# Patient Record
Sex: Female | Born: 1955 | Race: White | Hispanic: No | Marital: Married | Smoking: Never smoker
Health system: Southern US, Community
[De-identification: ages and names within clinical notes are randomized; demographics above are authoritative.]

## PROBLEM LIST (undated history)

## (undated) DIAGNOSIS — Z9289 Personal history of other medical treatment: Secondary | ICD-10-CM

## (undated) DIAGNOSIS — D069 Carcinoma in situ of cervix, unspecified: Secondary | ICD-10-CM

## (undated) DIAGNOSIS — C50919 Malignant neoplasm of unspecified site of unspecified female breast: Secondary | ICD-10-CM

## (undated) DIAGNOSIS — Z9889 Other specified postprocedural states: Secondary | ICD-10-CM

## (undated) HISTORY — DX: Other specified postprocedural states: Z98.890

## (undated) HISTORY — DX: Personal history of other medical treatment: Z92.89

## (undated) HISTORY — DX: Malignant neoplasm of unspecified site of unspecified female breast: C50.919

## (undated) HISTORY — DX: Carcinoma in situ of cervix, unspecified: D06.9

---

## 1986-12-19 DIAGNOSIS — D069 Carcinoma in situ of cervix, unspecified: Secondary | ICD-10-CM

## 1986-12-19 HISTORY — DX: Carcinoma in situ of cervix, unspecified: D06.9

## 2002-03-06 ENCOUNTER — Ambulatory Visit (HOSPITAL_COMMUNITY): Admission: RE | Admit: 2002-03-06 | Discharge: 2002-03-06 | Payer: Self-pay | Admitting: Gastroenterology

## 2002-03-06 ENCOUNTER — Encounter: Payer: Self-pay | Admitting: Gastroenterology

## 2002-12-19 HISTORY — PX: BREAST ENHANCEMENT SURGERY: SHX7

## 2003-06-17 ENCOUNTER — Other Ambulatory Visit: Admission: RE | Admit: 2003-06-17 | Discharge: 2003-06-17 | Payer: Self-pay | Admitting: Obstetrics and Gynecology

## 2004-08-30 ENCOUNTER — Other Ambulatory Visit: Admission: RE | Admit: 2004-08-30 | Discharge: 2004-08-30 | Payer: Self-pay | Admitting: Obstetrics and Gynecology

## 2004-09-06 ENCOUNTER — Encounter: Admission: RE | Admit: 2004-09-06 | Discharge: 2004-09-06 | Payer: Self-pay | Admitting: Obstetrics and Gynecology

## 2005-06-20 ENCOUNTER — Other Ambulatory Visit: Admission: RE | Admit: 2005-06-20 | Discharge: 2005-06-20 | Payer: Self-pay | Admitting: Obstetrics and Gynecology

## 2005-12-27 ENCOUNTER — Other Ambulatory Visit: Admission: RE | Admit: 2005-12-27 | Discharge: 2005-12-27 | Payer: Self-pay | Admitting: Obstetrics and Gynecology

## 2007-10-15 ENCOUNTER — Encounter: Admission: RE | Admit: 2007-10-15 | Discharge: 2007-10-15 | Payer: Self-pay | Admitting: Obstetrics and Gynecology

## 2009-09-03 ENCOUNTER — Encounter: Admission: RE | Admit: 2009-09-03 | Discharge: 2009-09-03 | Payer: Self-pay | Admitting: Obstetrics and Gynecology

## 2009-09-15 ENCOUNTER — Encounter: Admission: RE | Admit: 2009-09-15 | Discharge: 2009-09-15 | Payer: Self-pay | Admitting: Gastroenterology

## 2009-09-18 DIAGNOSIS — Z9289 Personal history of other medical treatment: Secondary | ICD-10-CM

## 2009-09-18 DIAGNOSIS — Z9889 Other specified postprocedural states: Secondary | ICD-10-CM

## 2009-09-18 HISTORY — DX: Personal history of other medical treatment: Z92.89

## 2009-09-18 HISTORY — DX: Other specified postprocedural states: Z98.890

## 2011-07-13 ENCOUNTER — Other Ambulatory Visit: Payer: Self-pay | Admitting: Obstetrics and Gynecology

## 2011-07-13 DIAGNOSIS — R928 Other abnormal and inconclusive findings on diagnostic imaging of breast: Secondary | ICD-10-CM

## 2011-07-19 ENCOUNTER — Ambulatory Visit
Admission: RE | Admit: 2011-07-19 | Discharge: 2011-07-19 | Disposition: A | Discharge status: Home / Self Care | Payer: BC Managed Care – PPO | Source: Ambulatory Visit | Attending: Obstetrics and Gynecology | Admitting: Obstetrics and Gynecology

## 2011-07-19 ENCOUNTER — Other Ambulatory Visit: Payer: Self-pay

## 2011-07-19 ENCOUNTER — Other Ambulatory Visit: Payer: Self-pay | Admitting: Obstetrics and Gynecology

## 2011-07-19 DIAGNOSIS — R928 Other abnormal and inconclusive findings on diagnostic imaging of breast: Secondary | ICD-10-CM

## 2011-07-20 ENCOUNTER — Other Ambulatory Visit: Payer: Self-pay | Admitting: Obstetrics and Gynecology

## 2011-07-20 DIAGNOSIS — C50911 Malignant neoplasm of unspecified site of right female breast: Secondary | ICD-10-CM

## 2011-07-23 ENCOUNTER — Ambulatory Visit
Admission: RE | Admit: 2011-07-23 | Discharge: 2011-07-23 | Disposition: A | Discharge status: Home / Self Care | Payer: BC Managed Care – PPO | Source: Ambulatory Visit | Attending: Obstetrics and Gynecology | Admitting: Obstetrics and Gynecology

## 2011-07-23 DIAGNOSIS — C50911 Malignant neoplasm of unspecified site of right female breast: Secondary | ICD-10-CM

## 2011-07-23 MED ORDER — GADOBENATE DIMEGLUMINE 529 MG/ML IV SOLN
13.0000 mL | Freq: Once | INTRAVENOUS | Status: AC | PRN
  Administered 2011-07-23: 13 mL via INTRAVENOUS

## 2011-08-03 ENCOUNTER — Ambulatory Visit (HOSPITAL_BASED_OUTPATIENT_CLINIC_OR_DEPARTMENT_OTHER): Payer: BC Managed Care – PPO | Admitting: General Surgery

## 2011-08-03 ENCOUNTER — Other Ambulatory Visit: Payer: Self-pay | Admitting: Oncology

## 2011-08-03 ENCOUNTER — Encounter (HOSPITAL_BASED_OUTPATIENT_CLINIC_OR_DEPARTMENT_OTHER): Payer: BC Managed Care – PPO | Admitting: Oncology

## 2011-08-03 ENCOUNTER — Encounter (INDEPENDENT_AMBULATORY_CARE_PROVIDER_SITE_OTHER): Payer: Self-pay | Admitting: General Surgery

## 2011-08-03 VITALS — BP 106/70 | HR 64 | Temp 98.1°F | Resp 20 | Ht 62.3 in

## 2011-08-03 DIAGNOSIS — C50219 Malignant neoplasm of upper-inner quadrant of unspecified female breast: Secondary | ICD-10-CM

## 2011-08-03 DIAGNOSIS — C50419 Malignant neoplasm of upper-outer quadrant of unspecified female breast: Secondary | ICD-10-CM

## 2011-08-03 DIAGNOSIS — C50919 Malignant neoplasm of unspecified site of unspecified female breast: Secondary | ICD-10-CM | POA: Insufficient documentation

## 2011-08-03 LAB — CBC WITH DIFFERENTIAL/PLATELET
BASO%: 0.8 % (ref 0.0–2.0)
Basophils Absolute: 0.1 10*3/uL (ref 0.0–0.1)
EOS%: 1.7 % (ref 0.0–7.0)
MCH: 29.9 pg (ref 25.1–34.0)
MCHC: 34.9 g/dL (ref 31.5–36.0)
MCV: 85.7 fL (ref 79.5–101.0)
MONO%: 5.2 % (ref 0.0–14.0)
RBC: 4.46 10*6/uL (ref 3.70–5.45)
RDW: 12 % (ref 11.2–14.5)
lymph#: 1.7 10*3/uL (ref 0.9–3.3)

## 2011-08-03 LAB — COMPREHENSIVE METABOLIC PANEL
ALT: 13 U/L (ref 0–35)
AST: 14 U/L (ref 0–37)
Albumin: 4 g/dL (ref 3.5–5.2)
Alkaline Phosphatase: 48 U/L (ref 39–117)
BUN: 15 mg/dL (ref 6–23)
Calcium: 9.5 mg/dL (ref 8.4–10.5)
Chloride: 99 mEq/L (ref 96–112)
Potassium: 3.7 mEq/L (ref 3.5–5.3)

## 2011-08-03 NOTE — Progress Notes (Signed)
Subjective:     Patient ID: Marisa Washington, female   DOB: July 17, 1956, 55 y.o.   MRN: [id number]  HPI This is a 55 year old Caucasian female, referred to the breast multidisciplinary clinic by Dr. Cain Saupe at the [facility]. Dr. Richardean Chimera is her primary care physician in [place].  The patient was recently diagnosed with breast cancer in the upper inner quadrant of the right breast. She has no history of any breast problems  in the past. She had bilateral subpectoral implants placed in 2004 by Dr. Babette Relic. She does not perceive any mass or abnormality of her breasts.  Recent mammograms and ultrasound were performed and they show a 1 cm mass in the right breast, upper inner quadrant, 1:00 position, 2 cm from the nipple. This is nonpalpable. Image guided biopsy shows invasive and in situ carcinoma which is most likely lobular in phenotype. Estrogen and progesterone are  positive. HER-2 is equivocal. Ki 67 is 15%.  MRI was subsequently performed and this shows a 1.2 cm solitary mass in the medial aspect of the right breast, similar location to the mammogram and ultrasound.  She was seen at [facility] hours ago, and was given the option of mastectomy versus lumpectomy and postop radiation therapy and chemotherapy. She is here today for a second opinion.  She used to live in [place] now lives with her husband in [place].  Past Medical History  Diagnosis Date  . Carcinoma in situ of cervix 1988    Treated [facility]  . History of colonoscopy 10/10  . History of bone density study 10/10   Current Outpatient Prescriptions  Medication Sig Dispense Refill  . calcium citrate-vitamin D 200-200 MG-UNIT TABS Take 1,200 tablets by mouth daily.        . ergocalciferol (VITAMIN D2) 50000 UNITS capsule Take 4,000 Units by mouth daily.         Allergies  Allergen Reactions  . Cinnamon Anaphylaxis    Family History  Problem Relation Age  of Onset  . Ovarian cancer Maternal Aunt   . Bone cancer Cousin     History  Substance Use Topics  . Smoking status: Never Smoker   . Smokeless tobacco: Not on file  . Alcohol Use: No      Review of Systems  Constitutional: Negative.   HENT: Negative.   Eyes: Negative.   Respiratory: Negative.   Cardiovascular: Negative.   Genitourinary: Negative.   Musculoskeletal: Negative.   Neurological: Negative.   Psychiatric/Behavioral: Negative.        Objective:   Physical Exam  Constitutional: She is oriented to person, place, and time. She appears well-developed and well-nourished. No distress.  HENT:  Head: Normocephalic and atraumatic.  Nose: Nose normal.  Mouth/Throat: No oropharyngeal exudate.  Eyes: Conjunctivae and EOM are normal. Pupils are equal, round, and reactive to light. No scleral icterus.  Neck: Normal range of motion. Neck supple. No JVD present. No tracheal deviation present. No thyromegaly present.  Cardiovascular: Normal rate, regular rhythm, normal heart sounds and intact distal pulses.  Exam reveals no gallop.   No murmur heard. Pulmonary/Chest: Effort normal and breath sounds normal. No respiratory distress. She has no wheezes. She has no rales. She exhibits no tenderness.    Abdominal: Soft. Bowel sounds are normal. She exhibits no distension and no mass. There is no tenderness. There is no rebound and no guarding.  Musculoskeletal: Normal range of motion. She exhibits no edema and no  tenderness.  Lymphadenopathy:    She has no cervical adenopathy.  Neurological: She is alert and oriented to person, place, and time. She exhibits normal muscle tone. Coordination normal.  Skin: Skin is warm and dry. No rash noted. She is not diaphoretic. No erythema. No pallor.  Psychiatric: She has a normal mood and affect. Her behavior is normal. Judgment and thought content normal.       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 Grade 1/3. Receptors positive, HER-2 equivocal, Ki 67 15%.  Positive family history for ovarian cancer in a maternal aunt.  Negative family history for breast cancer.  Status post bilateral subpectoral saline implants.    Plan:     We spent a lot of time talking about surgical options. At the end of the conversation she clearly would like to have breast conservation. I think she is a good candidate for partial mastectomy, sentinel lymph node mapping and biopsy, and adjuvant radiation therapy.  Dr. Pierce Crane is undecided whether she needs oncotype DX. Will decide post op..  She and her husband willgo home and decide whether she will have her surgery here or at [facility].  She will probably have her adjuvant radiation therapy in [place] since she lives much closer to [facility].  I will wait to hear her decision.

## 2011-08-03 NOTE — Patient Instructions (Signed)
I feel that you are a good candidate for breast conservation. The next setting your care will be a partial mastectomy with needle localization, sentinel lymph node biopsy, and postop radiation therapy. Her medical oncologist and radiation oncologist will decide about the details of your other therapies.  You are still deciding as to whether you want to have her surgery here or at [facility]. Please give [place] a call in the next few days when she make her decision. We will be happy to help you with your treatment plans in any way we can.

## 2011-08-08 ENCOUNTER — Telehealth (INDEPENDENT_AMBULATORY_CARE_PROVIDER_SITE_OTHER): Payer: Self-pay | Admitting: General Surgery

## 2012-11-01 ENCOUNTER — Other Ambulatory Visit: Payer: Self-pay | Admitting: Obstetrics and Gynecology

## 2012-11-13 ENCOUNTER — Other Ambulatory Visit: Payer: Self-pay | Admitting: Obstetrics and Gynecology

## 2014-08-04 ENCOUNTER — Other Ambulatory Visit: Payer: Self-pay | Admitting: Obstetrics and Gynecology

## 2014-08-08 LAB — CYTOLOGY - PAP

## 2015-03-30 ENCOUNTER — Other Ambulatory Visit: Payer: Self-pay | Admitting: Obstetrics and Gynecology

## 2015-03-31 LAB — CYTOLOGY - PAP

## 2018-09-28 ENCOUNTER — Encounter: Payer: Self-pay | Admitting: Cardiovascular Disease

## 2018-09-28 ENCOUNTER — Ambulatory Visit (INDEPENDENT_AMBULATORY_CARE_PROVIDER_SITE_OTHER): Payer: 59 | Admitting: Cardiovascular Disease

## 2018-09-28 VITALS — BP 124/68 | HR 66 | Ht 61.0 in | Wt 153.4 lb

## 2018-09-28 DIAGNOSIS — I10 Essential (primary) hypertension: Secondary | ICD-10-CM | POA: Diagnosis not present

## 2018-09-28 DIAGNOSIS — R609 Edema, unspecified: Secondary | ICD-10-CM

## 2018-09-28 DIAGNOSIS — R000 Tachycardia, unspecified: Secondary | ICD-10-CM

## 2018-09-28 DIAGNOSIS — Z8249 Family history of ischemic heart disease and other diseases of the circulatory system: Secondary | ICD-10-CM | POA: Diagnosis not present

## 2018-09-28 NOTE — Progress Notes (Signed)
Cardiology Office Note   Date:  09/28/2018   ID:  Marisa Washington, DOB 1956-02-16, MRN [id number]  PCP:  Patient, No Pcp Per  Cardiologist:   Skeet Latch, MD   No chief complaint on file.     History of Present Illness: Marisa Washington is a 62 y.o. female with prior breast cancer who is being seen today for the evaluation of palpitations at the request of Arvella Nigh, MD.  [name], Dr. [name] and noted that her heart sometimes feels like it is racing.  This occurs almost daily.  She starts feeling funny and then checks her heart rate is between 90 and 120.  It was happening daily but it seems to be low.  She also notices that her exercise heart rate 30 and still 120 bpm she is noticed that her friends have the same issue and she wonders if it is normal in 2014 she saw [facility] for similar symptoms.  This was after being treated with chemotherapy and radiation for breast cancer.  She had an echocardiogram at that time that revealed normal systolic function.  She also wore a 24-hour Holter monitor that revealed sinus tachycardia.  [name] has not been feeling stressed.  She has 1 glass of tea daily but no coffee.  She has mild lower extremity edema that gets somewhat better in the morning but does not improve entirely.  She denies orthopnea or PND.  She has an extensive family history of CAD.  Her father needed bypass in his 74s.  Her paternal grandfather had a heart attack in his 30s.  Her mother and older siblings have heart failure.  She exercises by walking 20 to 30 minutes 3 days/week.  She has no exertional symptoms.  She notes that she has not interested in taking medications at all possible.    Past Medical History:  Diagnosis Date  . Carcinoma in situ of cervix [facility]  . History of bone density study 10/10  . History of colonoscopy 10/10    Past Surgical History:  Procedure Laterality Date  . BREAST ENHANCEMENT SURGERY  2004  .  CESAREAN SECTION  92, 98     Current Outpatient Medications  Medication Sig Dispense Refill  . B Complex Vitamins (VITAMIN B COMPLEX PO) Take by mouth.    . Calcium Citrate 250 MG TABS Take by mouth.    . Coenzyme Q10 (COQ10 PO) Take by mouth.    . cyanocobalamin ([employer] VITAMIN B12) 1000 MCG tablet Take by mouth.    . ergocalciferol (VITAMIN D2) 50000 UNITS capsule Take 4,000 Units by mouth daily.      Marland Kitchen exemestane (AROMASIN) 25 MG tablet Take by mouth.    . Lactobacillus Rhamnosus, GG, (CULTURELLE) CAPS Take by mouth.    . Magnesium Gluconate (MAGNESIUM 27 PO) Take by mouth.    . Omega-3 1000 MG CAPS Take by mouth.    . tolterodine (DETROL LA) 4 MG 24 hr capsule Take by mouth.    . TURMERIC PO Take by mouth.    . vitamin E 100 UNIT capsule Take by mouth.     No current facility-administered medications for this visit.     Allergies:   Cinnamon    Social History:  The patient  reports that she has never smoked. She has never used smokeless tobacco. She reports that she does not drink alcohol.   Family History:  The patient's family history includes Bladder  Cancer in her cousin; Bone cancer in her cousin; Breast cancer in her maternal aunt; CAD in her father; Congestive Heart Failure in her mother; Heart attack in her maternal grandfather and paternal grandfather; High blood pressure in her son; Ovarian cancer in her maternal aunt and maternal aunt; Stroke in her maternal grandmother; Valvular heart disease in her mother.    ROS:  Please see the history of present illness.   Otherwise, review of systems are positive for none.   All other systems are reviewed and negative.    PHYSICAL EXAM: VS:  BP 124/68   Pulse 66   Ht 5\' 1"  (2.330 m)   Wt 153 lb 6.4 oz (69.6 kg)   BMI 28.98 kg/m  , BMI Body mass index is 28.98 kg/m. GENERAL:  Well appearing HEENT:  Pupils equal round and reactive, fundi not visualized, oral mucosa unremarkable NECK:  No jugular venous distention, waveform  within normal limits, carotid upstroke brisk and symmetric, no bruits, no thyromegaly LYMPHATICS:  No cervical adenopathy LUNGS:  Clear to auscultation bilaterally HEART:  RRR.  PMI not displaced or sustained,S1 and S2 within normal limits, no S3, no S4, no clicks, no rubs, no murmurs ABD:  Flat, positive bowel sounds normal in frequency in pitch, no bruits, no rebound, no guarding, no midline pulsatile mass, no hepatomegaly, no splenomegaly EXT:  2 plus pulses throughout, no edema, no cyanosis no clubbing SKIN:  No rashes no nodules NEURO:  Cranial nerves II through XII grossly intact, motor grossly intact throughout PSYCH:  Cognitively intact, oriented to person place and time   EKG:  EKG is ordered today. The ekg ordered today demonstrates sinus rhythm.  Rate 66 bpm.   Recent Labs: No results found for requested labs within last 8760 hours.   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  She previously wore a monitor at [facility].  It seems that there is little utility in repeating this as her symptoms are exactly the same.  Overall she would like to not have medications if possible.  Given that she is minimally symptomatically we will not do any beta-blockers at this time.  We will get an echocardiogram.  If there is any evidence of heart failure then we will have to reconsider.  She already had the appropriate laboratory testing 12/2017 and her symptoms have not changed since that time.   # FH heart failure: # Edema: # Tachycardia: [name] doesn't have any evidence of heart failure on exam.  However, she has a strong family history and  has had some lower extremity edema.  We will get an echocardiogram as above.  # Hyperlipidemia:  ASCVD 10 year risk 4.5%.  Given her family history we will get a coronary calcium score to better determine if she needs statin therapy.   Current medicines are reviewed at length with the patient today.  The patient does not have concerns regarding medicines.  The following changes have been made:  no change  Labs/ tests ordered today include:   Orders Placed This Encounter  Procedures  . CT CARDIAC SCORING  . EKG 12-Lead  . ECHOCARDIOGRAM  COMPLETE     Disposition:   FU with Macai Sisneros C. Oval Linsey, MD, [facility] in 2 months.      Signed, Detric Scalisi C. Oval Linsey, MD, [facility]  09/28/2018 4:51 PM    [facility]

## 2018-09-28 NOTE — Patient Instructions (Addendum)
Medication Instructions:  Your physician recommends that you continue on your current medications as directed. Please refer to the Current Medication list given to you today.  If you need a refill on your cardiac medications before your next appointment, please call your pharmacy.   Lab work: NONE  Testing/Procedures: CALCIUM SCORE, $150 OUT OF POCKET  Your physician has requested that you have an echocardiogram. Echocardiography is a painless test that uses sound waves to create images of your heart. It provides your doctor with information about the size and shape of your heart and how well your heart's chambers and valves are working. This procedure takes approximately one hour. There are no restrictions for this procedure.  BOTH OF THE ABOVE WITH BE DONE AT [place] AT [place] [address] ON THE SAME DAY IF POSSIBLE   Follow-Up: At [facility], you and your health needs are our priority.  As part of our continuing mission to provide you with exceptional heart care, we have created designated Provider Care Teams.  These Care Teams include your primary Cardiologist (physician) and Advanced Practice Providers (APPs -  Physician Assistants and Nurse Practitioners) who all work together to provide you with the care you need, when you need it. You will need a follow up appointment in 2 months. DR [facility] or one of the following Advanced Practice Providers on your designated Care Team:   Kerin Ransom, PA-C [place], [place] . Sande Rives, PA-C  Echocardiogram An echocardiogram, or echocardiography, uses sound waves (ultrasound) to produce an image of your heart. The echocardiogram is simple, painless, obtained within a short period of time, and offers valuable information to your health care provider. The images from an echocardiogram can provide information such as:  Evidence of coronary artery disease (CAD).  Heart size.  Heart muscle function.  Heart valve  function.  Aneurysm detection.  Evidence of a past heart attack.  Fluid buildup around the heart.  Heart muscle thickening.  Assess heart valve function.  Tell a health care provider about:  Any allergies you have.  All medicines you are taking, including vitamins, herbs, eye drops, creams, and over-the-counter medicines.  Any problems you or family members have had with anesthetic medicines.  Any blood disorders you have.  Any surgeries you have had.  Any medical conditions you have.  Whether you are pregnant or may be pregnant. What happens before the procedure? No special preparation is needed. Eat and drink normally. What happens during the procedure?  In order to produce an image of your heart, gel will be applied to your chest and a wand-like tool (transducer) will be moved over your chest. The gel will help transmit the sound waves from the transducer. The sound waves will harmlessly bounce off your heart to allow the heart images to be captured in real-time motion. These images will then be recorded.  You may need an IV to receive a medicine that improves the quality of the pictures. What happens after the procedure? You may return to your normal schedule including diet, activities, and medicines, unless your health care provider tells you otherwise. This information is not intended to replace advice given to you by your health care provider. Make sure you discuss any questions you have with your health care provider. Document Released: 12/02/2000 Document Revised: 07/23/2016 Document Reviewed: 08/12/2013 Elsevier Interactive Patient Education  2017 [employer].

## 2018-10-11 ENCOUNTER — Other Ambulatory Visit (HOSPITAL_COMMUNITY): Payer: 59

## 2018-10-11 ENCOUNTER — Other Ambulatory Visit: Payer: 59

## 2018-10-23 ENCOUNTER — Other Ambulatory Visit: Payer: Self-pay

## 2018-10-23 ENCOUNTER — Ambulatory Visit (INDEPENDENT_AMBULATORY_CARE_PROVIDER_SITE_OTHER)
Admission: RE | Admit: 2018-10-23 | Discharge: 2018-10-23 | Disposition: A | Discharge status: Home / Self Care | Payer: Self-pay | Source: Ambulatory Visit | Attending: Cardiovascular Disease | Admitting: Cardiovascular Disease

## 2018-10-23 ENCOUNTER — Ambulatory Visit (HOSPITAL_COMMUNITY): Payer: 59 | Attending: Cardiovascular Disease

## 2018-10-23 DIAGNOSIS — R609 Edema, unspecified: Secondary | ICD-10-CM | POA: Insufficient documentation

## 2018-10-23 DIAGNOSIS — R000 Tachycardia, unspecified: Secondary | ICD-10-CM

## 2018-11-22 ENCOUNTER — Ambulatory Visit: Payer: 59 | Admitting: Cardiovascular Disease

## 2020-01-03 ENCOUNTER — Telehealth: Payer: Self-pay | Admitting: *Deleted

## 2020-01-03 NOTE — Telephone Encounter (Signed)
Patient returned call and scheduled appt.

## 2020-01-03 NOTE — Telephone Encounter (Signed)
Attempted to reach the patient to schedule an appt., left the patient a message to call the office back

## 2020-01-07 ENCOUNTER — Other Ambulatory Visit: Payer: Self-pay

## 2020-01-27 ENCOUNTER — Encounter: Payer: Self-pay | Admitting: Gynecologic Oncology

## 2020-01-27 NOTE — Progress Notes (Signed)
GYNECOLOGIC ONCOLOGY NEW PATIENT CONSULTATION   Patient Name: Marisa Washington  Patient Age: 64 y.o. Date of Service: 01/31/20 Referring Provider: Dr. Radene Knee  Primary Care Provider: Patient, No Pcp Per Consulting Provider: Jeral Pinch, MD   Assessment/Plan:  Postmenopausal patient with history of breast cancer currently on Aromasin with family history of both breast and ovarian cancer (negative genetic testing) presenting due to an elevated Ca1 25.  I had a long discussion with the patient and her husband regarding her family history as well as known personal history.  Although she has negative genetic testing, I suspect that she has some increased risk (likely 2-3 fold) of ovarian cancer given her family history.  We spoke about the limitations of the tools that we have for ovarian cancer screening, notably pelvic ultrasound and Ca1 25.  While the patient had a normal pelvic ultrasound, her Ca1 25 was elevated at 61.  I stressed that this is not a sensitive or specific test and that many processes, including inflammatory processes, can cause an elevation in Ca1 25.  I recommended that we start by repeating a CA-125 today.  The patient understands that there may be some variability the result due to the lab.  In terms of next steps, we discussed the possibility of continued close surveillance with recommendation for ultrasound, CA-125 and pelvic exam every 6-12 months, similar to patients with a known genetic susceptibility that increases the risk of ovarian cancer.  I also think it would be quite reasonable to move forward with risk reducing surgery in the form of bilateral salpingo-oophorectomy.  We briefly discussed this surgery and that it could be performed in the outpatient setting in a minimally invasive fashion.  We discussed the small but present risk for occult cancer at the time of surgery.  The patient ultimately would like to wait to see what her repeat CA-125 result is and then we  will discuss by phone early next week next steps.  If she decides to move forward with bilateral salpingo-oophorectomy, we will have her return for a visit to include an exam and preoperative counseling.  We briefly discussed concurrent hysterectomy and, based on what she reports is her history, I do not believe that there is a strong indication for removal of her uterus at this time.  It sounds like she has either had ASCUS Paps or low-grade dysplasia if her Paps truly have been abnormal since her excisional procedure excellently 30 years ago.  A copy of this note was sent to the patient's referring provider.   45 minutes of total time was spent for this patient encounter, including preparation, face-to-face counseling with the patient and coordination of care, and documentation of the encounter.   Jeral Pinch, MD  Division of Gynecologic Oncology  Department of Obstetrics and Gynecology  [facility]  ___________________________________________  Chief Complaint: Chief Complaint  Patient presents with  . Elevated CA-125  . History of breast cancer in female    History of Present Illness:  Marisa Washington is a 64 y.o. y.o. female who is seen in consultation at the request of Dr. Radene Knee for an evaluation of an elevated CA-125 with a personal history of breast cancer and family history of breast and ovarian cancer, with negative genetic testing.  Patient reports overall doing well.  She endorses a regular appetite with no nausea or emesis.  She has a longstanding history of what she describes as food intolerance including intermittent abdominal pain and gas  related to certain foods.  She denies any change to her bowel function or urinary symptoms.  She has had some mild and intermittent left mid back pain for the last 2 months with no associated symptoms.  She denies any history of kidney stones.  Her family history is significant for a maternal aunt diagnosed with  ovarian cancer at age 62, deceased in her mid 62s.  She also has a maternal great aunt with a history of breast cancer and a maternal great aunt with a history of ovarian cancer.  She denies any family history of uterine or colon cancer.  The patient had myriad genetic testing in September 2019 that was negative for any clinically significant mutation.  Patient reports that this is the first time she has had a CA-125 or ultrasound performed given her family history.  PAST MEDICAL HISTORY:  Past Medical History:  Diagnosis Date  . Breast cancer ([place])   . 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 History of bone density study 10/10  . History of colonoscopy 10/10     PAST SURGICAL HISTORY:  Past Surgical History:  Procedure Laterality Date  . BREAST ENHANCEMENT SURGERY  2004  . BREAST LUMPECTOMY Right 07/2011  . CESAREAN SECTION  92, 98  . TUBAL LIGATION      OB/GYN HISTORY:  OB History  Gravida Para Term Preterm AB Living  '3 2     1 2  ' SAB TAB Ectopic Multiple Live Births  1            # Outcome Date GA Lbr Len/2nd Weight Sex Delivery Anes PTL Lv  3 SAB           2 Para           1 Para             No LMP recorded. (Menstrual status: Other).  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  She notes that her Pap tests were never normal until her most recent one performed with Dr. Renold Don.  She denies any history of cervical biopsies or further procedures despite these abnormal Pap test.  SCREENING STUDIES:  Last mammogram: 11/2019  Last colonoscopy: 2010 Last bone mineral density: 12/2019  MEDICATIONS: Outpatient Encounter  Medications as of 01/31/2020  Medication Sig  . B Complex Vitamins (VITAMIN B COMPLEX PO) Take by mouth.  . Calcium Citrate 250 MG TABS Take by mouth.  . Cholecalciferol (VITAMIN D3 PO) Take 1 tablet by mouth daily.  . Coenzyme Q10 (COQ10 PO) Take by mouth.  . cyanocobalamin ([employer] VITAMIN B12) 1000 MCG tablet Take by mouth.  . Ergocalciferol (VITAMIN D2 PO) Take by mouth.  Marland Kitchen exemestane (AROMASIN) 25 MG tablet Take 25 mg by mouth daily.  . Lactobacillus Rhamnosus, GG, (CULTURELLE) CAPS Take by mouth.  . Magnesium Gluconate (MAGNESIUM 27 PO) Take by mouth.  . Omega-3 1000 MG CAPS Take by mouth.  . TURMERIC PO Take by mouth.  . vitamin E 100 UNIT capsule Take by mouth.  . [DISCONTINUED] ergocalciferol (VITAMIN D2) 50000 UNITS capsule Take 4,000 Units by mouth daily.    . [  DISCONTINUED] exemestane (AROMASIN) 25 MG tablet Take by mouth.  . [DISCONTINUED] fluticasone (FLONASE) 50 MCG/ACT nasal spray Place 2 sprays into both nostrils daily.  . [DISCONTINUED] tolterodine (DETROL LA) 4 MG 24 hr capsule Take by mouth.   No facility-administered encounter medications on file as of 01/31/2020.    ALLERGIES:  Allergies  Allergen Reactions  . Cinnamon Anaphylaxis  . Epinephrine Palpitations    Heart racing Heart racing      FAMILY HISTORY:  Family History  Problem Relation Age of Onset  . Ovarian cancer Maternal Aunt 20       died at 71  . Bone cancer Cousin   . High blood pressure Son   . Congestive Heart Failure Mother   . Valvular heart disease Mother   . CAD Father   . Stroke Maternal Grandmother   . Heart attack Maternal Grandfather   . Heart attack Paternal Grandfather   . Bladder Cancer Cousin      SOCIAL HISTORY:    Social Connections:   . Frequency of Communication with Friends and Family: Not on file  . Frequency of Social Gatherings with Friends and Family: Not on file  . Attends Religious Services: Not on file  . Active Member of Clubs or Organizations: Not on file   . Attends Archivist Meetings: Not on file  . Marital Status: Not on file    REVIEW OF SYSTEMS:  Denies appetite changes, fevers, chills, fatigue, unexplained weight changes. Denies hearing loss, neck lumps or masses, mouth sores, ringing in ears or voice changes. Denies cough or wheezing.  Denies shortness of breath. Denies chest pain or palpitations. Denies leg swelling. Denies abdominal distention, pain, blood in stools, constipation, diarrhea, nausea, vomiting, or early satiety. Denies pain with intercourse, dysuria, frequency, hematuria or incontinence. Denies hot flashes, pelvic pain, vaginal bleeding or vaginal discharge.   Denies joint pain, back pain or muscle pain/cramps. Denies itching, rash, or wounds. Denies dizziness, headaches, numbness or seizures. Denies swollen lymph nodes or glands, denies easy bruising or bleeding. Denies anxiety, depression, confusion, or decreased concentration.  Physical Exam:  Vital Signs for this encounter:  Blood pressure 108/66, pulse 65, temperature 98.5 F (36.9 C), temperature source Temporal, resp. rate 17, height '5\' 1"'  (1.549 m), weight 144 lb 14.4 oz (65.7 kg), SpO2 100 %. Body mass index is 27.38 kg/m. General: Alert, oriented, no acute distress.  HEENT: Normocephalic, atraumatic. Sclera anicteric.  Chest: Unlabored breathing on room air. Extremities: Grossly normal range of motion. Warm, well perfused. No edema bilaterally.  Skin: No rashes or lesions.  GU: Deferred.  LABORATORY AND RADIOLOGIC DATA:  Outside medical records were reviewed to synthesize the above history, along with the history and physical obtained during the visit.   CA-125 on 1/13: 61.5  Recent pelvic ultrasound: 01/01/20 at [facility] Uterus 4.8 x 2.7 x 3.4cm, endometrium 3.18m. Right ovary 2 x 1.2 x 1.2cm. Left ovary 1.5 x 1.2 x 1.5 cm. No adnexal masses or free fluid.

## 2020-01-28 ENCOUNTER — Telehealth: Payer: Self-pay | Admitting: Oncology

## 2020-01-28 ENCOUNTER — Encounter: Payer: Self-pay | Admitting: Gynecologic Oncology

## 2020-01-28 NOTE — Telephone Encounter (Signed)
Left a message for medical records at Dr. Ophelia Charter office requesting [facility] genetic testing results.  Requested a return call.

## 2020-01-29 NOTE — Telephone Encounter (Signed)
Called patient and she said she had genetic testing through Dr. Sherran Needs office.  Left a message for Johny Shears, Referral Coordinator at Dr. Sherran Needs office regarding genetic testing results.  Requested a return call.

## 2020-01-31 ENCOUNTER — Inpatient Hospital Stay: Payer: BC Managed Care – PPO | Attending: Gynecologic Oncology | Admitting: Gynecologic Oncology

## 2020-01-31 ENCOUNTER — Encounter: Payer: Self-pay | Admitting: Gynecologic Oncology

## 2020-01-31 ENCOUNTER — Inpatient Hospital Stay: Payer: BC Managed Care – PPO

## 2020-01-31 ENCOUNTER — Other Ambulatory Visit: Payer: Self-pay

## 2020-01-31 VITALS — BP 108/66 | HR 65 | Temp 98.5°F | Resp 17 | Ht 61.0 in | Wt 144.9 lb

## 2020-01-31 DIAGNOSIS — Z8041 Family history of malignant neoplasm of ovary: Secondary | ICD-10-CM | POA: Insufficient documentation

## 2020-01-31 DIAGNOSIS — Z853 Personal history of malignant neoplasm of breast: Secondary | ICD-10-CM

## 2020-01-31 DIAGNOSIS — R971 Elevated cancer antigen 125 [CA 125]: Secondary | ICD-10-CM

## 2020-01-31 DIAGNOSIS — C50919 Malignant neoplasm of unspecified site of unspecified female breast: Secondary | ICD-10-CM | POA: Diagnosis not present

## 2020-01-31 DIAGNOSIS — Z803 Family history of malignant neoplasm of breast: Secondary | ICD-10-CM

## 2020-01-31 DIAGNOSIS — Z79811 Long term (current) use of aromatase inhibitors: Secondary | ICD-10-CM | POA: Insufficient documentation

## 2020-01-31 NOTE — Patient Instructions (Signed)
It was a pleasure meeting you today.  I will call with the results of your CA-125, likely on Monday.  We can discuss at that point continued surveillance versus moving forward with surgery to remove your fallopian tubes and ovaries.  If you have any questions, you can always reach me at [phone].

## 2020-02-01 LAB — CA 125: Cancer Antigen (CA) 125: 105 U/mL — ABNORMAL HIGH (ref 0.0–38.1)

## 2020-02-03 ENCOUNTER — Telehealth: Payer: Self-pay | Admitting: Gynecologic Oncology

## 2020-02-03 ENCOUNTER — Other Ambulatory Visit: Payer: Self-pay | Admitting: Gynecologic Oncology

## 2020-02-03 ENCOUNTER — Telehealth: Payer: Self-pay | Admitting: *Deleted

## 2020-02-03 DIAGNOSIS — R971 Elevated cancer antigen 125 [CA 125]: Secondary | ICD-10-CM

## 2020-02-03 NOTE — Telephone Encounter (Signed)
Pt informed of lab appointment at 12:45 p.m. & CT appointment at 3:00 p.m. on 02/07/20.  Pt also informed of NPO status & contrast instructions.  Pt verbalizes understanding.

## 2020-02-03 NOTE — Telephone Encounter (Signed)
Called the patient to discuss her Ca1 51, which has increased to 105.  We discussed that there may be some variation just due to different labs but I think given the increase, I recommend proceeding with further evaluation.  We discussed options including getting a CT scan of the abdomen and pelvis to look for any evidence of metastatic disease with the plan to proceed with at least bilateral salpingo-oophorectomy at some point in the future, sooner if the CT scan shows anything concerning.  The other option would be to proceed with bilateral salpingo-oophorectomy with possible staging given findings at the time of surgery.  If normal abdominal and pelvic survey at the time of surgery including normal adnexa, the patient understands that there is a risk that microscopic cancer would be found on final pathology requiring a second surgery to stage.  The patient would like some time to think about what she would like to do and we will call her later today to either get a CT scan and appointment scheduled versus OR date.  Jeral Pinch MD Gynecologic Oncology

## 2020-02-06 ENCOUNTER — Other Ambulatory Visit: Payer: Self-pay | Admitting: Gynecologic Oncology

## 2020-02-06 DIAGNOSIS — R971 Elevated cancer antigen 125 [CA 125]: Secondary | ICD-10-CM

## 2020-02-07 ENCOUNTER — Inpatient Hospital Stay: Payer: BC Managed Care – PPO

## 2020-02-07 ENCOUNTER — Other Ambulatory Visit: Payer: Self-pay

## 2020-02-07 ENCOUNTER — Ambulatory Visit (HOSPITAL_COMMUNITY)
Admission: RE | Admit: 2020-02-07 | Discharge: 2020-02-07 | Disposition: A | Discharge status: Home / Self Care | Payer: BC Managed Care – PPO | Source: Ambulatory Visit | Attending: Gynecologic Oncology | Admitting: Gynecologic Oncology

## 2020-02-07 DIAGNOSIS — R971 Elevated cancer antigen 125 [CA 125]: Secondary | ICD-10-CM | POA: Insufficient documentation

## 2020-02-07 LAB — BASIC METABOLIC PANEL
Anion gap: 10 (ref 5–15)
BUN: 29 mg/dL — ABNORMAL HIGH (ref 8–23)
CO2: 27 mmol/L (ref 22–32)
Calcium: 9.7 mg/dL (ref 8.9–10.3)
Chloride: 104 mmol/L (ref 98–111)
Creatinine, Ser: 0.79 mg/dL (ref 0.44–1.00)
GFR calc Af Amer: >60 mL/min (ref >60)
GFR calc non Af Amer: >60 mL/min (ref >60)
Glucose, Bld: 84 mg/dL (ref 70–99)
Potassium: 4.1 mmol/L (ref 3.5–5.1)
Sodium: 141 mmol/L (ref 135–145)

## 2020-02-07 MED ORDER — SODIUM CHLORIDE (PF) 0.9 % IJ SOLN
INTRAMUSCULAR | Status: AC
  Filled 2020-02-07: qty 50

## 2020-02-07 MED ORDER — IOHEXOL 300 MG/ML  SOLN
100.0000 mL | Freq: Once | INTRAMUSCULAR | Status: AC | PRN
  Administered 2020-02-07: 16:00:00 100 mL via INTRAVENOUS

## 2020-02-10 ENCOUNTER — Telehealth: Payer: Self-pay | Admitting: Gynecologic Oncology

## 2020-02-10 NOTE — Telephone Encounter (Signed)
Called the patient and her husband back this morning.  I had spoken with them on Saturday afternoon when I got the results of her CT scan.  First thing this morning, I called and spoke with staff at Dr. Lorane Gell office regarding referral placement for a GI oncologist.  The patient's preference continues to be to receive care at [facility], as this is where she has received all of her breast cancer care.  Kalima was able to confirm that the referral had been placed within the [facility] system this morning, which I shared with the patient.  She will let me know if she has not heard anything about scheduling an appointment later in the week.  Jeral Pinch MD Gynecologic Oncology

## 2020-02-11 ENCOUNTER — Telehealth: Payer: Self-pay | Admitting: Oncology

## 2020-02-11 NOTE — Telephone Encounter (Signed)
Marisa Washington (husband) called and requested a CD of the CT scan from 02/07/20.  He said [name] is going to be seen at [facility].  Requested CD with [name] in [facility].  Notified [name] that they can pick it up this afternoon in the [facility] waiting room.

## 2020-02-12 ENCOUNTER — Telehealth: Payer: Self-pay | Admitting: *Deleted

## 2020-02-12 ENCOUNTER — Telehealth: Payer: Self-pay | Admitting: Oncology

## 2020-02-12 NOTE — Telephone Encounter (Signed)
Marisa Washington and her sister called back and said they have now contacted [facility] and they can do a 24-48 hour review once she has a biopsy.  They have also contacted [facility] and were told that a biopsy can be done this week.  They are wondering if Dr. Berline Lopes would be able to put in the order for [facility].  Discussed with Dr. Berline Lopes and she said we can refer her to GI at [facility] for direction and then order the biopsy.  Called [name] back and she would like the referral to [facility].  Called [facility] at [phone] and was transferred to the Liver Clinic.  Left a message for the new patient coordinator.

## 2020-02-12 NOTE — Telephone Encounter (Signed)
Patient called and stated "I saw a GI doctor at [facility], but they can't do my biopsy until the second week in March. Can Dr Berline Lopes try to get me in somewhere else sooner. I have used both [facility] and [facility]." Explained that I would give the message to Dr Berline Lopes and we would call her back. Explained that if a referral was placed with [facility]; they will most likely call the same day the referral is placed.

## 2020-02-12 NOTE — Telephone Encounter (Signed)
Left a message regarding biopsy.  Requested a return call.

## 2020-02-12 NOTE — Telephone Encounter (Addendum)
Marisa Washington called back and said she is trying to go to [facility] but can't get an appointment without a biopsy.  She is scheduled at [facility] on 02/24/20 and would like to have it sooner. Explained to her that she may not be able to have it done at [facility] Washington sooner because it has to be reviewed first by radiology and then scheduled.  Also that Marisa Washington would need to see Marisa Washington consult note to see what the recommendations are for the biopsy. Advised her that we will keep watching for Marisa Washington note and let her know when we see it.

## 2020-02-13 NOTE — Telephone Encounter (Signed)
Left a message with [facility] nurse triage asking for Dr. Alwyn Pea recommendations.    Called Marisa Washington and discussed that if she goes to [facility], she will need to see a provider there first and then have the biopsy scheduled.  Advised her it may be better for her to have the biopsy at [facility] but can't promise that it will be sooner than 02/24/20.  She said would like to have the biopsy done at [facility] if possible.    Discussed again that we need Dr. Alwyn Pea recommendations before proceeding and to not cancel the biopsy on 02/24/20 since we may not get one scheduled here before then.  She verbalized understanding.

## 2020-02-14 ENCOUNTER — Telehealth: Payer: Self-pay | Admitting: Gynecologic Oncology

## 2020-02-14 NOTE — Telephone Encounter (Signed)
Received a message that the patient was interested in having her biopsy performed at [facility].  I called the patient this morning and talked with her as well as her sister, Otila Kluver, who was also on the phone.  I discussed my concern again with the patient that it can cause delays in her care down the road to have her work-up done at multiple institutions.  If she is planning to have part or all of her cancer treatment at [facility], I am strongly recommending that she wait to have the biopsy there.  If she would like her care here, then I am happy to refer her to a GI oncologist and place the order for a CT-guided biopsy.  The patient is planning to be seen at [facility] (they tell me that her case is under review there) and otherwise would like to keep her care at [facility].  They are understanding of the technical difficulties if a biopsy is done here with having to send/transfer tissue to other institutions for review as well as for testing in the future if needed.  Ultimately, they voiced understanding of my concerns I would like to keep their care at [facility] and will plan for the biopsy is currently scheduled on March 8.  Jeral Pinch MD Gynecologic Oncology

## 2020-05-15 IMAGING — CT CT ABD-PELV W/ CM
2 of 5 series · 15 of 46 positions shown, 17 images · IV contrast (APPLIED)
Comparison: No priors.

CLINICAL DATA: 63-year-old female with history of increasing CA
125. Strong family history of breast and ovarian cancer. Personal
history of breast cancer.

EXAM:
CT ABDOMEN AND PELVIS WITH CONTRAST
TECHNIQUE: Multidetector CT imaging of the abdomen and pelvis was performed
using the standard protocol following bolus administration of
intravenous contrast.
CONTRAST:  100mL OMNIPAQUE IOHEXOL 300 MG/ML  SOLN

[Series 2: axial st · axial · 0.76mm/px · z∈[-550,-175]mm · 12 of 87 slices shown, 14 images]
[im 6/87  soft-tissue]
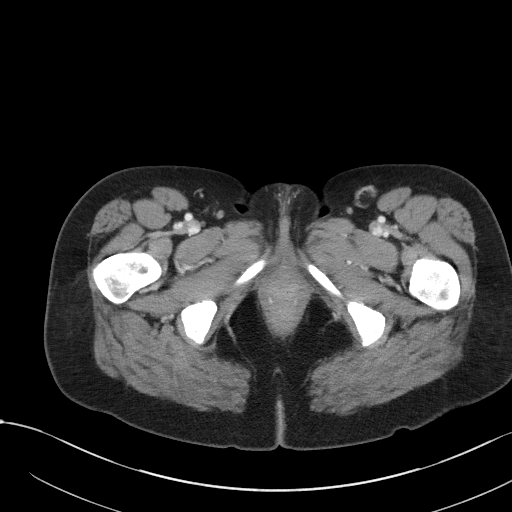
[im 6/87  bone]
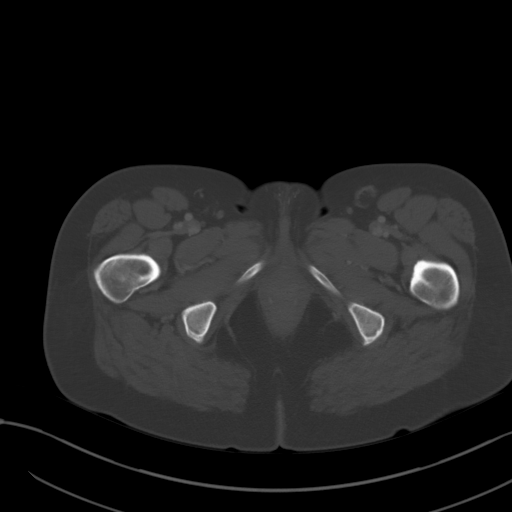
[im 12/87  soft-tissue]
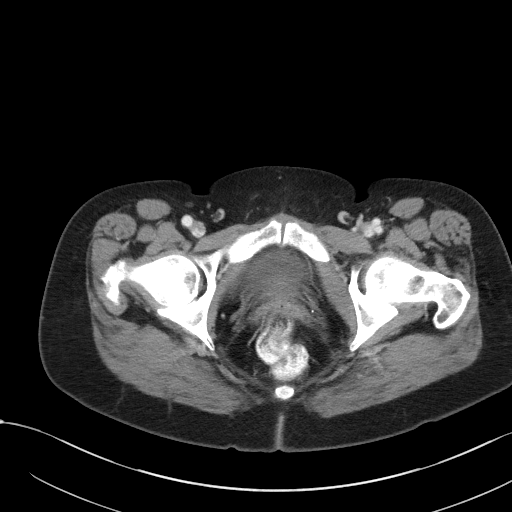
[im 18/87  soft-tissue]
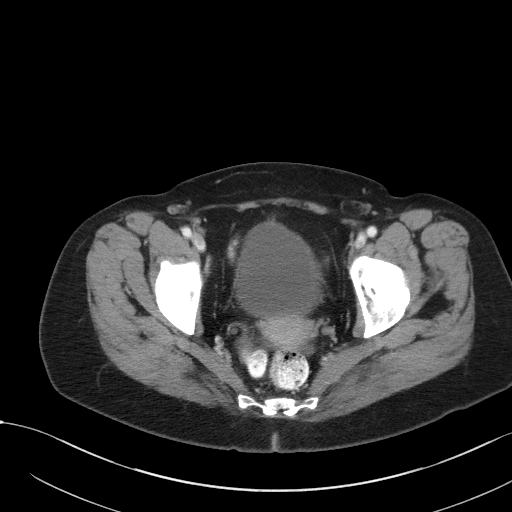
[im 29/87  soft-tissue]
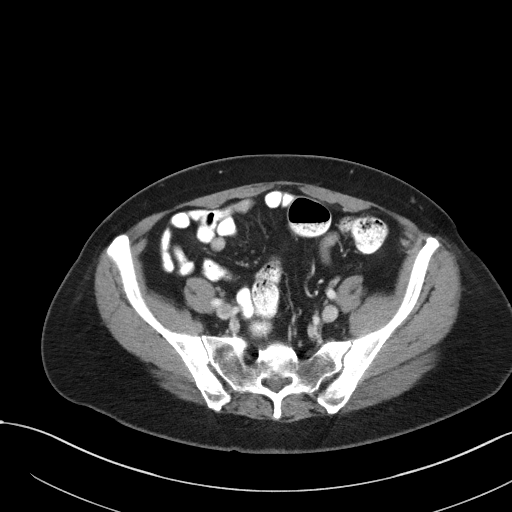
[im 35/87  soft-tissue]
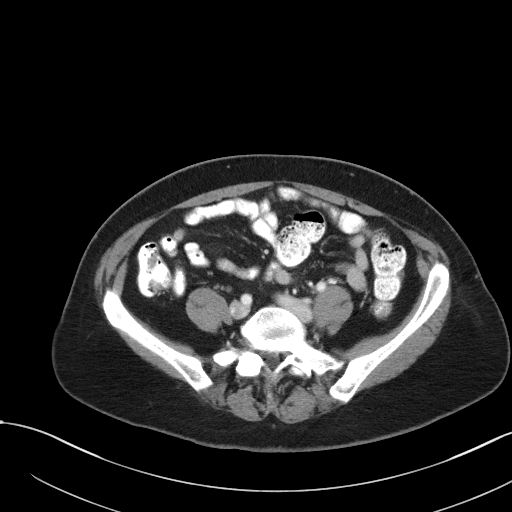
[im 41/87  soft-tissue]
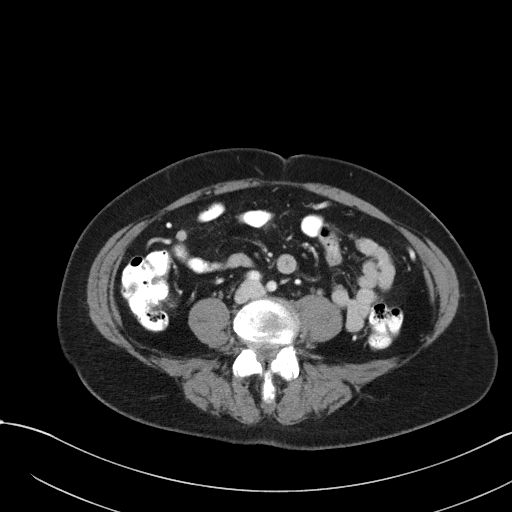
[im 46/87  soft-tissue]
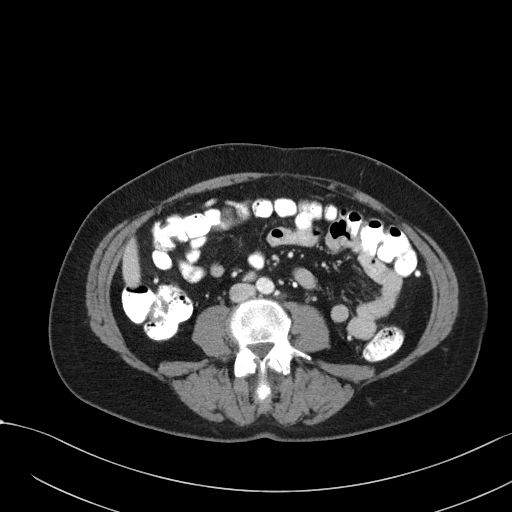
[im 52/87  soft-tissue]
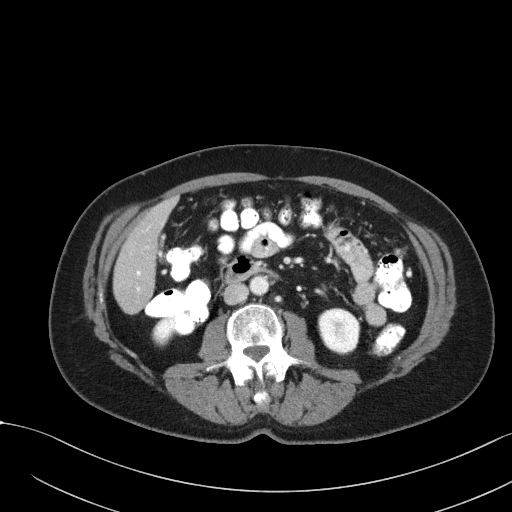
[im 58/87  soft-tissue]
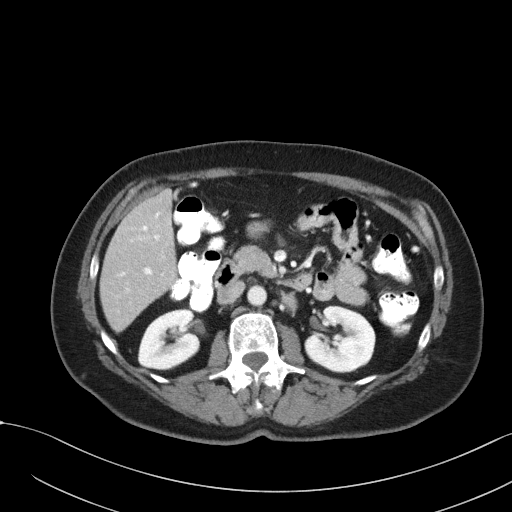
[im 58/87  bone]
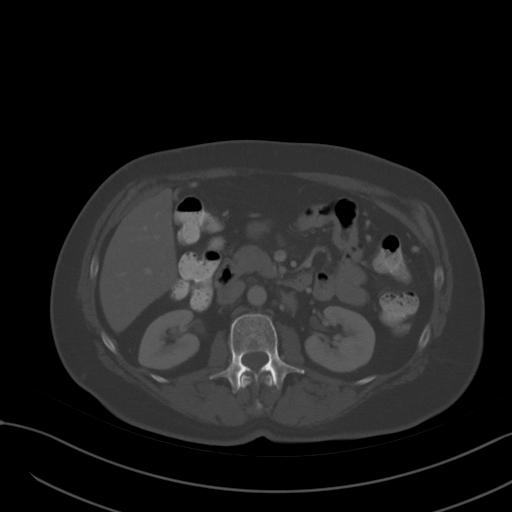
[im 69/87  soft-tissue]
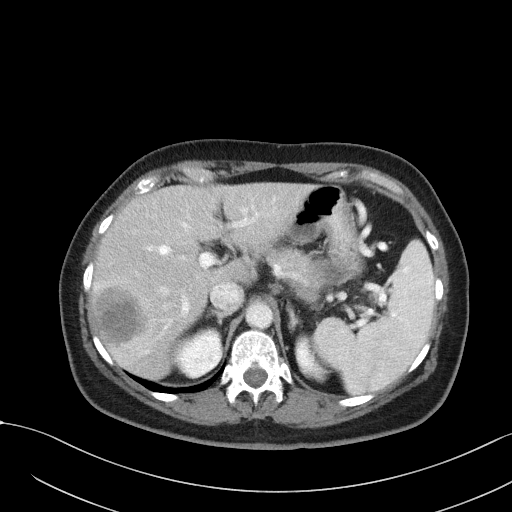
[im 75/87  soft-tissue]
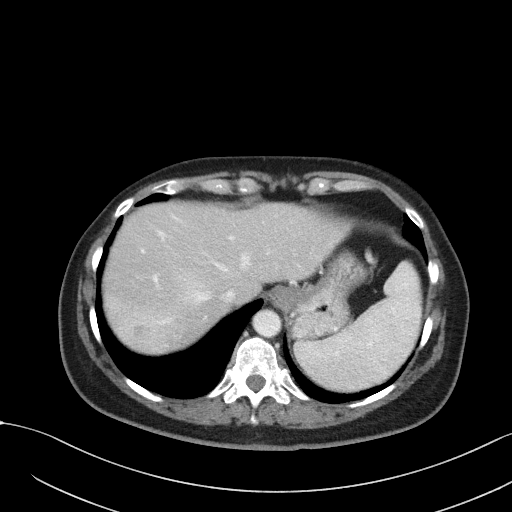
[im 81/87  soft-tissue]
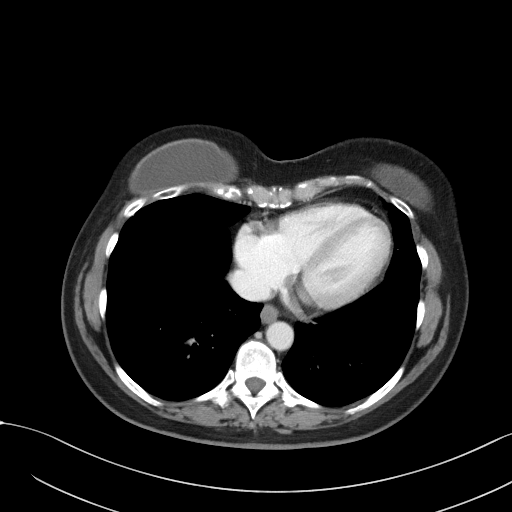

[Series 5: coronal st · coronal · 0.68mm/px · 3 of 80 slices shown]
[im 27/80  soft-tissue]
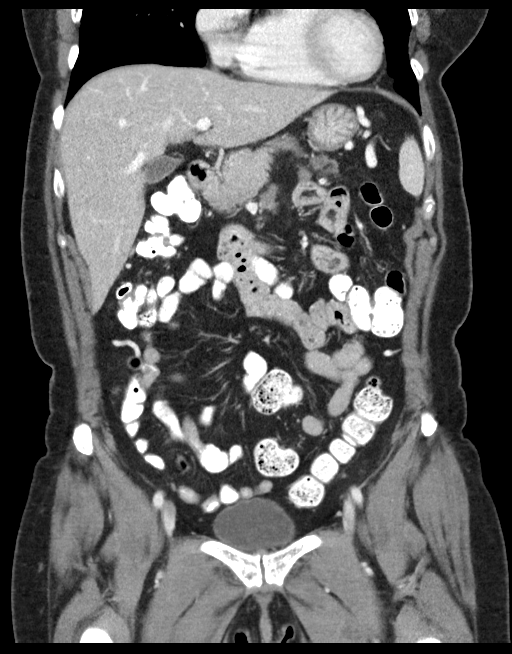
[im 36/80  soft-tissue]
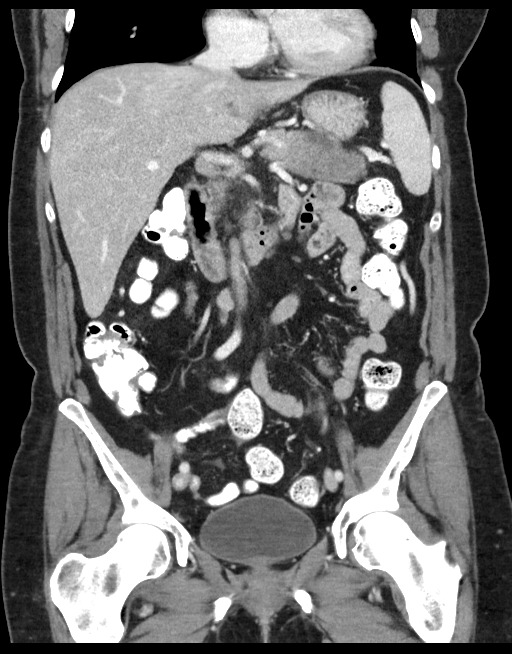
[im 44/80  soft-tissue]
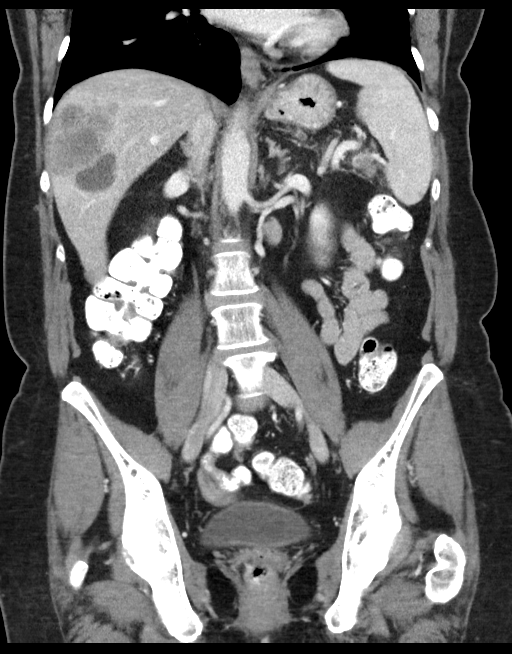

[15 of 46 positions shown; findings below may reference images not displayed]

FINDINGS: Lower chest: Bilateral breast implants are incidentally noted.

Hepatobiliary: In the right lobe of the liver predominantly in
segments 6 and 7 there is a complex multilocular heterogeneously
enhancing lesion which is generally hypovascular (axial image 17 of
series 2 and coronal image 45 of series 5) measuring 5.0 x 6.6 x
cm. Several other smaller hypovascular lesions are noted, largest of
which is in segment 5 (axial image 26 of series 2) measuring 1.2 cm.
No intra or extrahepatic biliary ductal dilatation. Gallbladder is
normal in appearance.

Pancreas: In the distal body and tail of the pancreas there is a
large hypovascular mass measuring 7.5 x 4.7 x 3.2 cm. This mass
appears infiltrative extending into adjacent soft tissues, and
causes narrowing of the splenic vein which remains patent at this
time. This lesion comes in direct contact with the undersurface of
the stomach, completely obliterating the intervening fat plane (best
appreciated on coronal image 35 of series 5). Proximal body and head
of the pancreas are otherwise normal in appearance. No pancreatic
ductal dilatation. No peripancreatic fluid collections or
inflammatory changes.

Spleen: Unremarkable.

Adrenals/Urinary Tract: Bilateral kidneys and adrenal glands are
normal in appearance. No hydroureteronephrosis. Urinary bladder is
normal in appearance.

Stomach/Bowel: The undersurface of the body of the stomach comes in
direct contact with the aggressive appearing pancreatic mass
(discussed above). No pathologic dilatation of small bowel or colon.
Normal appendix.

Vascular/Lymphatic: Aortic atherosclerosis, without evidence of
aneurysm or dissection in the abdominal or pelvic vasculature.
Narrowing of the splenic vein as it traverses adjacent to the large
pancreatic mass. At this time, the lesion is separate from the
superior mesenteric artery and vein which are both widely patent.
Multiple prominent borderline enlarged and mildly enlarged
retroperitoneal lymph nodes, largest of which is a left para-aortic
lymph node near the renal hilum (axial image 31 of series 2)
measuring 1.3 cm in short axis. Mildly enlarged mesenteric lymph
node adjacent to the superior mesenteric vein (axial image 27 of
series 2) measuring 1 cm in short axis.

Reproductive: Uterus and ovaries are unremarkable in appearance.

Other: No significant volume of ascites.  No pneumoperitoneum.

Musculoskeletal: There are no aggressive appearing lytic or blastic
lesions noted in the visualized portions of the skeleton.
IMPRESSION: 1. Large mass in the distal body and tail of the pancreas estimated
to measure approximately 7.5 x 4.7 x 3.2 cm, highly concerning for
primary pancreatic neoplasm. There is associated lymphadenopathy and
multiple aggressive appearing liver lesions which likely reflect
metastatic disease, as detailed above.
2. Ovaries are unremarkable in appearance.
3. Aortic atherosclerosis.
4. Additional incidental findings, as above.

## 2020-12-19 DEATH — deceased
